# Patient Record
Sex: Male | Born: 1995 | Race: White | Hispanic: No | Marital: Single | Smoking: Current every day smoker
Health system: Southern US, Community
[De-identification: ages and names within clinical notes are randomized; demographics above are authoritative.]

## PROBLEM LIST (undated history)

## (undated) DIAGNOSIS — F988 Other specified behavioral and emotional disorders with onset usually occurring in childhood and adolescence: Secondary | ICD-10-CM

## (undated) HISTORY — PX: EYE SURGERY: SHX253

---
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
## 2015-10-25 ENCOUNTER — Ambulatory Visit (INDEPENDENT_AMBULATORY_CARE_PROVIDER_SITE_OTHER): Payer: Self-pay | Admitting: Physician Assistant

## 2015-10-25 VITALS — BP 140/86 | HR 97 | Temp 99.3°F | Resp 16

## 2015-10-25 DIAGNOSIS — S61201A Unspecified open wound of left index finger without damage to nail, initial encounter: Secondary | ICD-10-CM

## 2015-10-25 DIAGNOSIS — S61209A Unspecified open wound of unspecified finger without damage to nail, initial encounter: Secondary | ICD-10-CM

## 2015-10-25 MED ORDER — CEPHALEXIN 500 MG PO CAPS: 500.0000 mg | ORAL_CAPSULE | Freq: Three times a day (TID) | ORAL | Status: AC

## 2015-10-25 NOTE — Progress Notes (Signed)
   Patient ID: Zachary Bauer, male    DOB: Jul 29, 1995, 20 y.o.   MRN: [id number]  PCP: No PCP Per Patient  Chief Complaint  Patient presents with  . Laceration    left pointer finger while cutting    Subjective:  HPI Presents for evaluation of a wound of the LEFT index finger. He is accompanied by a friend.  RIGHT hand dominant. While chopping herbs, he accidentally cut the distal index finger, through the nail. Last tetanus was <3 years ago, by report. No previous injuries to this finger/hand.  Doesn't want stitches, afraid of the needle.   Review of Systems  Skin: Positive for wound.  Neurological: Negative for weakness and numbness.       There are no active problems to display for this patient.   No Known Allergies  Prior to Admission medications   Not on File     Past Medical, Surgical Family and Social History reviewed and updated.        Objective:  Physical Exam  Constitutional: He is oriented to person, place, and time. He appears well-developed and well-nourished. He is active and cooperative. No distress.  BP 140/86 mmHg  Pulse 97  Temp(Src) 99.3 F (37.4 C) (Oral)  Resp 16  SpO2 100%   Eyes: Conjunctivae are normal.  Pulmonary/Chest: Effort normal.  Musculoskeletal:       Left hand: He exhibits tenderness (wound), deformity (soft tissue wound) and laceration. He exhibits normal range of motion, no bony tenderness, normal capillary refill and no swelling. Normal sensation noted. Normal strength noted.       Hands: Neurological: He is alert and oriented to person, place, and time.  Skin: Skin is warm and dry. Laceration (distal index finger, radial aspect with near avulsion of a flap of skin that includes the nail. Bleeding.) noted. No rash noted. He is not diaphoretic. No pallor.  Psychiatric: His speech is normal and behavior is normal. Judgment and thought content normal. His mood appears anxious. His affect is not angry, not blunt, not  labile and not inappropriate. Cognition and memory are normal. He does not exhibit a depressed mood.    PROCEDURE: Verbal consent obtained from patient.  Anesthesia by metacarpal block with 4cc Lidocaine 2% without epinephrine.  Wound scrubbed with soap and water and rinsed.  Wound closed with #5 4-0 Ethilon simple interrupted sutures.  Wound cleansed and dressed.        Assessment & Plan:  1. Wound, open, finger, initial encounter Local wound care. Anticipatory guidance. RTC 3 days for wound check, 10 days for suture removal. - cephALEXin (KEFLEX) 500 MG capsule; Take 1 capsule (500 mg total) by mouth 3 (three) times daily.  Dispense: 30 capsule; Refill: 0   Fernande Brashelle S. Waverly Chavarria, PA-C Physician Assistant-Certified [facility]

## 2015-10-25 NOTE — Progress Notes (Signed)
   Subjective:    Patient ID: Zachary Bauer, male    DOB: 03-03-1996, 20 y.o.   MRN: [id number]  HPI Zachary Bauer presents today for a laceration to his left index finger over the nail. 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 Rinsed the wound with water but no soap. Bleeding has currently stopped. Very afraid of needles and not excited about the possibility of needing stitches. Last tetanus booster was within the last 3 years. He is right handed.   Review of Systems  Skin: Positive for wound. Negative for pallor.       LEFT index finger  Psychiatric/Behavioral: The patient is nervous/anxious.        Afraid of the possibility of stitches  All other systems reviewed and are negative.      Objective:   Physical Exam  Constitutional: He is oriented to person, place, and time. Vital signs are normal. He appears well-developed and well-nourished. He is cooperative. No distress.  Blood pressure 140/86, pulse 97, temperature 99.3 F (37.4 C), temperature source Oral, resp. rate 16, SpO2 100 %.   HENT:  Head: Normocephalic and atraumatic.  Neck: Normal range of motion. Neck supple.  Musculoskeletal: Normal range of motion.       Left hand: He exhibits tenderness, deformity and laceration. He exhibits normal range of motion.       Hands: Neurological: He is alert and oriented to person, place, and time.  Skin: Skin is warm. He is not diaphoretic. No cyanosis. No pallor.  Psychiatric: His speech is normal and behavior is normal. Judgment and thought content normal. His mood appears anxious. His affect is not inappropriate. Cognition and memory are normal.    Procedure Laceration repair with foil grafting Verbal consent given by patient. Laceration to distal part of left index finger over the fingernail Physician(s): Chelle Jeffery PA-C, Fortune Brannigan Rayburn PA-S Description: Wound was cleaned and prepped and draped. Digital nerve block  administered using 4 cc lidocaine 2% without epinephrine. Wound closed with a small piece of foil cut to cover tissue defect and 5 simple interrupted sutures with 4-0 ethilon. Complications: none EBL: minimal Dispo: Patient tolerated procedure well. Incision c/d/i and dry dressing placed over.     Assessment & Plan:  1. Laceration to LEFT index finger - repaired with foil grafting  - return on Monday 7/24 for wound check - f/u in 10 days for suture removal - Keflex 500 mg PO TID x 10 days  Zachary Blank Rayburn PA-S 10/25/2015

## 2015-10-25 NOTE — Patient Instructions (Signed)
WOUND CARE Please return in 3 days for a wound check and in 10 days to have your stitches/staples removed or sooner if you have concerns. Marland Kitchen. Keep area clean and dry for 24 hours. Do not remove bandage, if applied. . After 24 hours, remove bandage and wash wound gently with mild soap and warm water. Reapply a new bandage after cleaning wound, if directed. . Continue daily cleansing with soap and water until stitches/staples are removed. . Do not apply any ointments or creams to the wound while stitches/staples are in place, as this may cause delayed healing. . Notify the office if you experience any of the following signs of infection: Swelling, redness, pus drainage, streaking, fever >101.0 F . Notify the office if you experience excessive bleeding that does not stop after 15-20 minutes of constant, firm pressure.      IF you received an x-ray today, you will receive an invoice from [facility]. Please contact [facility] at [phone] with questions or concerns regarding your invoice.   IF you received labwork today, you will receive an invoice from [facility]. Please contact [facility] at [phone] with questions or concerns regarding your invoice.   Our billing staff will not be able to assist you with questions regarding bills from these companies.  You will be contacted with the lab results as soon as they are available. The fastest way to get your results is to activate your My Chart account. Instructions are located on the last page of this paperwork. If you have not heard from us regarding the results in 2 weeks, please contact this office.

## 2015-11-04 ENCOUNTER — Encounter (HOSPITAL_COMMUNITY): Payer: Self-pay | Admitting: Emergency Medicine

## 2015-11-04 ENCOUNTER — Ambulatory Visit (HOSPITAL_COMMUNITY)
Admission: EM | Admit: 2015-11-04 | Discharge: 2015-11-04 | Disposition: A | Discharge status: Home / Self Care | Payer: Medicaid Other | Attending: Family Medicine | Admitting: Family Medicine

## 2015-11-04 DIAGNOSIS — Z4802 Encounter for removal of sutures: Secondary | ICD-10-CM

## 2015-11-04 NOTE — ED Provider Notes (Signed)
CSN: [id number]     Arrival date & time 11/04/15  1711 History   None    No chief complaint on file.  (Consider location/radiation/quality/duration/timing/severity/associated sxs/prior Treatment) Patient had his right index fingernail avulsed and he is having his sutures removed today.    Hand Pain  This is a new problem. The current episode started more than 1 week ago. The problem occurs constantly. The problem has not changed since onset.Nothing aggravates the symptoms. Nothing relieves the symptoms. He has tried nothing for the symptoms.    Past Medical History:  Diagnosis Date  . ADD (attention deficit disorder)    Past Surgical History:  Procedure Laterality Date  . EYE SURGERY     Family History  Problem Relation Age of Onset  . Cancer Other    Social History  Substance Use Topics  . Smoking status: Current Every Day Smoker    Packs/day: 0.50    Types: Cigarettes    Last attempt to quit: 08/29/2013  . Smokeless tobacco: Not on file  . Alcohol use No    Review of Systems  Constitutional: Negative.   HENT: Negative.   Eyes: Negative.   Respiratory: Negative.   Cardiovascular: Negative.   Gastrointestinal: Negative.   Endocrine: Negative.   Genitourinary: Negative.   Musculoskeletal: Negative.   Skin: Positive for wound.  Allergic/Immunologic: Negative.   Neurological: Negative.   Hematological: Negative.   Psychiatric/Behavioral: Negative.     Allergies  Review of patient's allergies indicates no known allergies.  Home Medications   Prior to Admission medications   Medication Sig Start Date End Date Taking? Authorizing Provider  cephALEXin (KEFLEX) 500 MG capsule Take 1 capsule (500 mg total) by mouth 3 (three) times daily. 10/25/15 11/04/15  Porfirio Oar, PA-C   Meds Ordered and Administered this Visit  Medications - No data to display  BP 116/83 (BP Location: Right Arm)   Pulse 70   Temp 98.5 F (36.9 C) (Oral)   SpO2 100%  No data  found.   Physical Exam  Constitutional: He appears well-developed and well-nourished.  Eyes: Conjunctivae and EOM are normal. Pupils are equal, round, and reactive to light.  Cardiovascular: Normal rate, regular rhythm, normal heart sounds and intact distal pulses.   Pulmonary/Chest: Effort normal and breath sounds normal.  Musculoskeletal:  Right index finger with half the finger nail avulsed off and tin foil is sutured over the  Nail bed.  Nursing note and vitals reviewed.   Urgent Care Course   Clinical Course    .Suture Removal Date/Time: 11/04/2015 6:35 PM Performed by: Deatra Canter Authorized by: Bradd Canary D   Consent:    Consent obtained:  Verbal   Consent given by:  Patient   Risks discussed:  Pain   Alternatives discussed:  No treatment Location:    Location:  Upper extremity   Upper extremity location:  Hand   Hand location:  L index finger   (including critical care time)  Labs Review Labs Reviewed - No data to display  Imaging Review No results found.   Visual Acuity Review  Right Eye Distance:   Left Eye Distance:   Bilateral Distance:    Right Eye Near:   Left Eye Near:    Bilateral Near:         MDM   1. Visit for suture removal    5 sutures left index finger removed  Continue with neosporin ointment and dressing on finger tip.   Deatra Canter, FNP  11/04/15 1838  

## 2015-11-04 NOTE — ED Triage Notes (Signed)
Patient seen at another location 7/21 for injury to left index finger.  Patient here today for suture removal

## 2016-12-07 ENCOUNTER — Emergency Department (HOSPITAL_COMMUNITY): Payer: Self-pay

## 2016-12-07 ENCOUNTER — Emergency Department (HOSPITAL_COMMUNITY)
Admission: EM | Admit: 2016-12-07 | Discharge: 2016-12-07 | Disposition: A | Discharge status: Home / Self Care | Payer: Self-pay | Attending: Emergency Medicine | Admitting: Emergency Medicine

## 2016-12-07 DIAGNOSIS — R062 Wheezing: Secondary | ICD-10-CM | POA: Insufficient documentation

## 2016-12-07 DIAGNOSIS — F17210 Nicotine dependence, cigarettes, uncomplicated: Secondary | ICD-10-CM | POA: Insufficient documentation

## 2016-12-07 DIAGNOSIS — B349 Viral infection, unspecified: Secondary | ICD-10-CM | POA: Insufficient documentation

## 2016-12-07 DIAGNOSIS — J069 Acute upper respiratory infection, unspecified: Secondary | ICD-10-CM | POA: Insufficient documentation

## 2016-12-07 LAB — RAPID STREP SCREEN (MED CTR MEBANE ONLY): Streptococcus, Group A Screen (Direct): NEGATIVE

## 2016-12-07 MED ORDER — IPRATROPIUM-ALBUTEROL 0.5-2.5 (3) MG/3ML IN SOLN
3.0000 mL | Freq: Once | RESPIRATORY_TRACT | Status: AC
  Administered 2016-12-07: 3 mL via RESPIRATORY_TRACT
  Filled 2016-12-07: qty 3

## 2016-12-07 MED ORDER — ALBUTEROL SULFATE HFA 108 (90 BASE) MCG/ACT IN AERS
2.0000 | INHALATION_SPRAY | Freq: Once | RESPIRATORY_TRACT | Status: AC
  Administered 2016-12-07: 2 via RESPIRATORY_TRACT
  Filled 2016-12-07: qty 6.7

## 2016-12-07 MED ORDER — ALBUTEROL SULFATE HFA 108 (90 BASE) MCG/ACT IN AERS: 1.0000 | INHALATION_SPRAY | Freq: Four times a day (QID) | RESPIRATORY_TRACT | 0 refills | Status: AC | PRN

## 2016-12-07 MED ORDER — AEROCHAMBER PLUS FLO-VU MEDIUM MISC
1.0000 | Freq: Once | Status: AC
  Administered 2016-12-07: 1

## 2016-12-07 NOTE — ED Triage Notes (Signed)
Pt states that he has had generalized body aches, chest pain when he coughs and a sore throat x 3 days. Alert and oriented.

## 2016-12-07 NOTE — ED Provider Notes (Signed)
[facility]-EMERGENCY DEPT Provider Note   CSN: [id number] Arrival date & time: 12/07/16  1141     History   Chief Complaint Chief Complaint  Patient presents with  . Generalized Body Aches  . Sore Throat    HPI Zachary Bauer is a 21 y.o. male presents to ED for evaluation of chest tightness, dry cough, chills and sore throat x 4 days. Has not tried any OTC for symptoms. No fevers, shortness of breath, chest pain. States a few people from work have been sick last week. He works around food. Smokes 1 pack of cigarettes every 3 days. No h/o asthma.   HPI  Past Medical History:  Diagnosis Date  . ADD (attention deficit disorder)     There are no active problems to display for this patient.   Past Surgical History:  Procedure Laterality Date  . EYE SURGERY         Home Medications    Prior to Admission medications   Medication Sig Start Date End Date Taking? Authorizing Provider  albuterol (PROVENTIL HFA;VENTOLIN HFA) 108 (90 Base) MCG/ACT inhaler Inhale 1-2 puffs into the lungs every 6 (six) hours as needed for wheezing or shortness of breath. 12/07/16   Liberty HandyGibbons, Bryann Gentz J, PA-C    Family History Family History  Problem Relation Age of Onset  . Cancer Other     Social History Social History  Substance Use Topics  . Smoking status: Current Every Day Smoker    Packs/day: 0.50    Types: Cigarettes    Last attempt to quit: 08/29/2013  . Smokeless tobacco: Not on file  . Alcohol use No     Allergies   Patient has no known allergies.   Review of Systems Review of Systems  Constitutional: Positive for chills. Negative for diaphoresis and fever.  HENT: Negative for congestion and sore throat.   Respiratory: Positive for cough and chest tightness. Negative for shortness of breath.   Cardiovascular: Negative for chest pain.  Musculoskeletal: Negative for myalgias.     Physical Exam Updated Vital Signs BP 123/85 (BP Location: Right Arm)   Pulse (!) 104    Temp 98.1 F (36.7 C) (Oral)   Resp 18   SpO2 100%   Physical Exam  Constitutional: He is oriented to person, place, and time. He appears well-developed and well-nourished. No distress.  NAD.  HENT:  Head: Normocephalic and atraumatic.  Right Ear: Tympanic membrane and external ear normal.  Left Ear: Tympanic membrane and external ear normal.  Nose: Nose normal. No mucosal edema.  Mouth/Throat: Mucous membranes are normal. Posterior oropharyngeal erythema present. Tonsils are 1+ on the right. Tonsils are 1+ on the left.  Eyes: Conjunctivae and EOM are normal. No scleral icterus.  Neck: Normal range of motion. Neck supple.  Cardiovascular: Normal rate, regular rhythm, normal heart sounds and intact distal pulses.   No murmur heard. Tachycardic   Pulmonary/Chest: Effort normal. He has wheezes in the right upper field, the right middle field, the left upper field and the left middle field.  Diffuse wheezing in upper and middle lobes bilaterally, louder on left>right  Musculoskeletal: Normal range of motion. He exhibits no deformity.  Neurological: He is alert and oriented to person, place, and time.  Skin: Skin is warm and dry. Capillary refill takes less than 2 seconds.  Psychiatric: He has a normal mood and affect. His behavior is normal. Judgment and thought content normal.  Nursing note and vitals reviewed.    ED Treatments /  Results  Labs (all labs ordered are listed, but only abnormal results are displayed) Labs Reviewed  RAPID STREP SCREEN (NOT AT [facility])  CULTURE, GROUP A STREP [facility])    EKG  EKG Interpretation None       Radiology Dg Chest 2 View  Result Date: 12/07/2016 CLINICAL DATA:  Pt states that he has had generalized body aches, chest pain when he coughs and a sore throat x 3 days. Current every day smoker. EXAM: CHEST - 2 VIEW COMPARISON:  none FINDINGS: Lungs are clear. Heart size and mediastinal contours are within normal limits. No effusion. Visualized  bones unremarkable. IMPRESSION: No acute cardiopulmonary disease. Electronically Signed   By: Corlis Leak M.D.   On: 12/07/2016 13:54    Procedures Procedures (including critical care time)  Medications Ordered in ED Medications  ipratropium-albuterol (DUONEB) 0.5-2.5 (3) MG/3ML nebulizer solution 3 mL (3 mLs Nebulization Given 12/07/16 1355)     Initial Impression / Assessment and Plan / ED Course  I have reviewed the triage vital signs and the nursing notes.  Pertinent labs & imaging results that were available during my care of the patient were reviewed by me and considered in my medical decision making (see chart for details).     21 y.o. -year-old male with pertinent pmh of tobacco abuse presents to the ED with URI symptoms  4 days. On my exam patient is nontoxic appearing, speaking in full sentences. No fever, no tachypnea, no tachycardia, normal oxygen saturations. He has diffuse wheezing L>R on exam. CXR negative. I think that symptoms can be treated conservatively at this point. Given reassuring physical exam and vital signs within normal limits patient will be discharged with symptomatic treatment and albuterol inhaler. Strict ED return precautions given. Patient is aware that a viral URI infection may precede the onset of pneumonia. Patient is aware of red flag symptoms to monitor for that would warrant return to the ED for further reevaluation.  Final Clinical Impressions(s) / ED Diagnoses   Final diagnoses:  Viral upper respiratory illness    New Prescriptions New Prescriptions   ALBUTEROL (PROVENTIL HFA;VENTOLIN HFA) 108 (90 BASE) MCG/ACT INHALER    Inhale 1-2 puffs into the lungs every 6 (six) hours as needed for wheezing or shortness of breath.     Liberty Handy, PA-C 12/07/16 1418    Lorre Nick, MD 12/10/16 1409

## 2016-12-07 NOTE — Discharge Instructions (Signed)
You were evaluated in the emergency department for cough, chest tightness and sore throat. You have wheezing in your lungs. Your x-ray was normal without pneumonia. I suspect you have a viral upper respiratory infection. This is usually self limiting and will resolve within 7-10 days.  Use albuterol inhaler for chest tightness and wheezing. Tylenol and ibuprofen for sore throat. Drink plenty of fluids. Stop smoking.   Return to ED if your symptoms worsen in 7-10 days total, if you develop fevers, shortness of breath, chest pain.

## 2016-12-09 LAB — CULTURE, GROUP A STREP (THRC)

## 2017-10-03 ENCOUNTER — Emergency Department (HOSPITAL_COMMUNITY)
Admission: EM | Admit: 2017-10-03 | Discharge: 2017-10-03 | Disposition: A | Discharge status: Home / Self Care | Payer: Self-pay | Attending: Emergency Medicine | Admitting: Emergency Medicine

## 2017-10-03 ENCOUNTER — Emergency Department (HOSPITAL_COMMUNITY): Payer: Self-pay

## 2017-10-03 DIAGNOSIS — F1910 Other psychoactive substance abuse, uncomplicated: Secondary | ICD-10-CM | POA: Insufficient documentation

## 2017-10-03 DIAGNOSIS — F17210 Nicotine dependence, cigarettes, uncomplicated: Secondary | ICD-10-CM | POA: Insufficient documentation

## 2017-10-03 DIAGNOSIS — R4701 Aphasia: Secondary | ICD-10-CM | POA: Insufficient documentation

## 2017-10-03 LAB — COMPREHENSIVE METABOLIC PANEL
ALBUMIN: 4.3 g/dL (ref 3.5–5.0)
ALT: 45 U/L — ABNORMAL HIGH (ref 0–44)
ANION GAP: 11 (ref 5–15)
AST: 28 U/L (ref 15–41)
Alkaline Phosphatase: 59 U/L (ref 38–126)
BILIRUBIN TOTAL: 0.4 mg/dL (ref 0.3–1.2)
BUN: 11 mg/dL (ref 6–20)
CO2: 21 mmol/L — AB (ref 22–32)
Calcium: 8.4 mg/dL — ABNORMAL LOW (ref 8.9–10.3)
Chloride: 111 mmol/L (ref 98–111)
Creatinine, Ser: 0.92 mg/dL (ref 0.61–1.24)
GFR calc Af Amer: >60 mL/min (ref >60)
GFR calc non Af Amer: >60 mL/min (ref >60)
Glucose, Bld: 107 mg/dL — ABNORMAL HIGH (ref 70–99)
POTASSIUM: 3.8 mmol/L (ref 3.5–5.1)
SODIUM: 143 mmol/L (ref 135–145)
Total Protein: 7.7 g/dL (ref 6.5–8.1)

## 2017-10-03 LAB — RAPID URINE DRUG SCREEN, HOSP PERFORMED
Amphetamines: NOT DETECTED
Benzodiazepines: NOT DETECTED
Cocaine: POSITIVE — AB
Opiates: NOT DETECTED
Tetrahydrocannabinol: POSITIVE — AB

## 2017-10-03 LAB — CBC
HEMATOCRIT: 47.9 % (ref 39.0–52.0)
HEMOGLOBIN: 17.3 g/dL — AB (ref 13.0–17.0)
MCH: 32.8 pg (ref 26.0–34.0)
MCHC: 36.1 g/dL — AB (ref 30.0–36.0)
MCV: 90.7 fL (ref 78.0–100.0)
Platelets: 285 10*3/uL (ref 150–400)
RBC: 5.28 MIL/uL (ref 4.22–5.81)
RDW: 12.6 % (ref 11.5–15.5)
WBC: 9.1 10*3/uL (ref 4.0–10.5)

## 2017-10-03 MED ORDER — LORAZEPAM 2 MG/ML IJ SOLN
0.5000 mg | Freq: Once | INTRAMUSCULAR | Status: AC | PRN
  Administered 2017-10-03: 1 mg via INTRAVENOUS
  Filled 2017-10-03: qty 1

## 2017-10-03 MED ORDER — GADOBENATE DIMEGLUMINE 529 MG/ML IV SOLN
20.0000 mL | Freq: Once | INTRAVENOUS | Status: AC | PRN
  Administered 2017-10-03: 20 mL via INTRAVENOUS

## 2017-10-03 MED ORDER — SODIUM CHLORIDE 0.9 % IV BOLUS
1000.0000 mL | Freq: Once | INTRAVENOUS | Status: AC
  Administered 2017-10-03: 1000 mL via INTRAVENOUS

## 2017-10-03 MED ORDER — VALPROATE SODIUM 500 MG/5ML IV SOLN
1000.0000 mg | Freq: Once | INTRAVENOUS | Status: AC
  Administered 2017-10-03: 1000 mg via INTRAVENOUS
  Filled 2017-10-03: qty 10

## 2017-10-03 MED ORDER — VALPROATE SODIUM 100 MG/ML IV SOLN: 1000.0000 mg | Freq: Once | INTRAVENOUS | Status: DC

## 2017-10-03 MED ORDER — PROCHLORPERAZINE EDISYLATE 10 MG/2ML IJ SOLN
10.0000 mg | Freq: Once | INTRAMUSCULAR | Status: AC
  Administered 2017-10-03: 10 mg via INTRAVENOUS
  Filled 2017-10-03: qty 2

## 2017-10-03 NOTE — ED Notes (Signed)
Patient states his friend noted 2 days ago he was having "difficulty getting his words out" after he did mushrooms.

## 2017-10-03 NOTE — Discharge Instructions (Signed)
Call [facility] to schedule a follow up appointment.  Neurology recommends avoiding all alcohol and drugs.  You can take folate or vitamin B12, both of which are available over-the-counter as directed on the label.  Continue to drink plenty of fluids and stay hydrated.  Return to the emergency department if you develop a high fever, have loss of vision in one or both eyes, develop persistent vomiting, or have other new, concerning symptoms.

## 2017-10-03 NOTE — ED Notes (Signed)
Pt to MRI

## 2017-10-03 NOTE — ED Triage Notes (Addendum)
Patient was acting dysphasic with EMS. Patient stated he drunk 3 beers. Patient couldn't form his sentences. Patient or friends could not tell when the symptoms started. Patient did not show any neurologic problems per EMS. Patient also states he ate mushrooms.

## 2017-10-03 NOTE — ED Provider Notes (Signed)
22 year old male [facility] MRI and labs. Per her HPI:   "22 year old male presents to the emergency department by EMS.  EMS was called by friend as patient did not appear to be acting himself.  He reports use of 3 mushrooms 2 days ago.  He has had some trouble with speech and communication since waking up yesterday.  He states that he feels as though he cannot get his thoughts together.  He did drink 3 beers after work today.  This was when a friend noticed that he had difficulty forming sentences.  He has no complaints of any pain, nausea.  No vomiting.  Denies use of any other illicit substances.  He does report prior use of mushrooms "when I was younger", but states that it has never had this effect."  Physical Exam  BP 117/72 (BP Location: Right Arm)   Pulse 83   Temp 98.2 F (36.8 C) (Oral)   Resp 14   Ht 5\' 6"  (1.676 m)   Wt 102.1 kg (225 lb)   SpO2 98%   BMI 36.32 kg/m   Physical Exam  Cranial nerves II through XII are grossly intact.  Patient is following simple commands.  No dysmetria with finger-to-nose bilaterally.  Patient appears to have blurred vision in the temporal peripheries of the bilateral eyes.  Nasal visual fields are intact bilaterally.  No loss of visual fields.  ED Course/Procedures     Procedures  MDM   22 year old male with no pertinent past medical history [place] [facility] [place] brain.  He has been having difficulty with speech and communication since yesterday morning.  He endorses having 3 alcoholic beverages last night, mushroom used 2 nights ago, and cocaine use 1 week ago.  Labs are unremarkable.  MRI is negative.  12:10 PM-MRI is negative.  On examination, the patient continues to have difficulty with word searching and questionable changes to the bilateral temporal visual fields.  Spoke with Dr. Amada JupiterKirkpatrick, neurology, who recommended Depakote and Compazine for possible complicated migraine versus  permanent damage secondary to psychoactive drug use.  Will also give the patient another liter of fluids as his urine looks very concentrated.   Patient rechecked following IV fluid, Depakote, and Compazine administration.  Family reports that now he is able to form fragments and sentences that are a couple of words long.  Previously, he was able to form 1 word answers.  The patient will be staying with his grandmother for the next few days.  He has been given a referral to outpatient neurology for follow-up.  He is also been advised to avoid all recreational and illicit drugs.  Strict return precautions given.  The patient is hemodynamically stable in no acute distress.  He is safe for discharge home at this time.      Barkley BoardsMcDonald, Batya Citron A, PA-C 10/03/17 1805    Terrilee FilesButler, Michael C, MD 10/05/17 1050

## 2017-10-03 NOTE — ED Provider Notes (Signed)
[facility]-EMERGENCY DEPT Provider Note   CSN: [id number] Arrival date & time: 10/03/17  0417    History   Chief Complaint Chief Complaint  Patient presents with  . Altered Mental Status  . Alcohol Intoxication    HPI Zachary Bauer is a 22 y.o. male.   22 year old male presents to the emergency department by EMS.  EMS was called by friend as patient did not appear to be acting himself.  He reports use of 3 mushrooms 2 days ago.  He has had some trouble with speech and communication since waking up yesterday.  He states that he feels as though he cannot get his thoughts together.  He did drink 3 beers after work today.  This was when a friend noticed that he had difficulty forming sentences.  He has no complaints of any pain, nausea.  No vomiting.  Denies use of any other illicit substances.  He does report prior use of mushrooms "when I was younger", but states that it has never had this effect.  The history is provided by the patient. No language interpreter was used.  Altered Mental Status    Alcohol Intoxication     Past Medical History:  Diagnosis Date  . ADD (attention deficit disorder)     There are no active problems to display for this patient.   Past Surgical History:  Procedure Laterality Date  . EYE SURGERY          Home Medications    Prior to Admission medications   Medication Sig Start Date End Date Taking? Authorizing Provider  albuterol (PROVENTIL HFA;VENTOLIN HFA) 108 (90 Base) MCG/ACT inhaler Inhale 1-2 puffs into the lungs every 6 (six) hours as needed for wheezing or shortness of breath. 12/07/16   Liberty Handy, PA-C    Family History Family History  Problem Relation Age of Onset  . Cancer Other     Social History Social History   Tobacco Use  . Smoking status: Current Every Day Smoker    Packs/day: 0.50    Types: Cigarettes    Last attempt to quit: 08/29/2013    Years since quitting: 4.0  Substance  Use Topics  . Alcohol use: No  . Drug use: No     Allergies   Patient has no known allergies.   Review of Systems Review of Systems Ten systems reviewed and are negative for acute change, except as noted in the HPI.    Physical Exam Updated Vital Signs BP 112/61   Pulse 100   Temp 98.2 F (36.8 C) (Oral)   Resp (!) 25   Ht 5\' 6"  (1.676 m)   Wt 102.1 kg (225 lb)   SpO2 94%   BMI 36.32 kg/m   Physical Exam  Constitutional: He is oriented to person, place, and time. He appears well-developed and well-nourished. No distress.  Nontoxic appearing; anxious  HENT:  Head: Normocephalic and atraumatic.  Eyes: No scleral icterus. Right eye exhibits nystagmus. Left eye exhibits nystagmus.  Bilateral injected conjunctiva  Neck: Normal range of motion.  Cardiovascular: Regular rhythm and intact distal pulses.  Borderline tachycardia  Pulmonary/Chest: Effort normal. No stridor. No respiratory distress. He has no wheezes. He has no rales.  Lungs CTAB  Abdominal: Soft. He exhibits no distension. There is no tenderness. There is no guarding.  Musculoskeletal: Normal range of motion.  Neurological: He is alert and oriented to person, place, and time. He exhibits normal muscle tone. Coordination normal.  Speech is  hesitant, but goal oriented. Seeming to have trouble with word finding. Answers questions appropriately when able to gather thoughts. No facial asymmetry. 5/5 strength against resistance in all major muscle groups bilaterally. Moving all extremities and ambulatory with steady gait.   Skin: Skin is warm and dry. No rash noted. He is not diaphoretic. No erythema. No pallor.  Psychiatric: He has a normal mood and affect. His behavior is normal.  Nursing note and vitals reviewed.    ED Treatments / Results  Labs (all labs ordered are listed, but only abnormal results are displayed) Labs Reviewed  RAPID URINE DRUG SCREEN, HOSP PERFORMED - Abnormal; Notable for the following  components:      Result Value   Cocaine POSITIVE (*)    Tetrahydrocannabinol POSITIVE (*)    Barbiturates   (*)    Value: Result not available. Reagent lot number recalled by manufacturer.   All other components within normal limits  CBC - Abnormal; Notable for the following components:   Hemoglobin 17.3 (*)    MCHC 36.1 (*)    All other components within normal limits  COMPREHENSIVE METABOLIC PANEL    EKG None  Radiology No results found.  Procedures Procedures (including critical care time)  Medications Ordered in ED Medications  LORazepam (ATIVAN) injection 0.5-1 mg (has no administration in time range)  sodium chloride 0.9 % bolus 1,000 mL (0 mLs Intravenous Stopped 10/03/17 0555)     Initial Impression / Assessment and Plan / ED Course  I have reviewed the triage vital signs and the nursing notes.  Pertinent labs & imaging results that were available during my care of the patient were reviewed by me and considered in my medical decision making (see chart for details).     4:35 AM 22 y/o male presents to the ED for speech difficulty. Symptoms noticed yesterday morning, persisted through work. Hx of mushroom ingestion on Thursday night. Denies use of other illicit substances. Did drink 3 beers tonight. Coworker called EMS over concern for continued speech changes. Patient denies pain, including headache, abdominal pain. No N/V. No fevers. Appears anxious and frustrated. Bilateral injected conjunctiva.  7:28 AM Patient reassessed. He remains aphasic, most c/w Broca's aphasia. Roommate at bedside, stating that patient ingested golden cap mushrooms. UDS also positive for cocaine and marijuana, but patient reports this use to be 1 week ago. He is noted to have bilateral nystagmus at EOMs on repeat exam by MD. 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  In light of recent polysubstance use, continued symptoms, MRI ordered for further  evaluation. Will check lytes, kidney, and liver function tests. If imaging negative, unable to completely exclude side effects of polysubstance use. Patient signed out to Frederik PearMia McDonald, PA-C at shift change who will follow up on imaging and disposition appropriately.   Final Clinical Impressions(s) / ED Diagnoses   Final diagnoses:  Aphasia  Polysubstance abuse [facility](HCC)    ED Discharge Orders    None       Antony MaduraHumes, Lavert Matousek, PA-C 10/03/17 [id number]    Nira Connardama, Pedro Eduardo, MD 10/03/17 2001

## 2017-10-03 NOTE — ED Notes (Signed)
Bed: WA17 Expected date:  Expected time:  Means of arrival:  Comments: EMS 22 yo male intoxicated and altered

## 2018-04-19 ENCOUNTER — Emergency Department (HOSPITAL_COMMUNITY): Payer: BLUE CROSS/BLUE SHIELD

## 2018-04-19 ENCOUNTER — Encounter (HOSPITAL_COMMUNITY): Payer: Self-pay | Admitting: Emergency Medicine

## 2018-04-19 ENCOUNTER — Emergency Department (HOSPITAL_COMMUNITY)
Admission: EM | Admit: 2018-04-19 | Discharge: 2018-04-19 | Disposition: A | Discharge status: Home / Self Care | Payer: BLUE CROSS/BLUE SHIELD | Attending: Emergency Medicine | Admitting: Emergency Medicine

## 2018-04-19 DIAGNOSIS — J101 Influenza due to other identified influenza virus with other respiratory manifestations: Secondary | ICD-10-CM | POA: Insufficient documentation

## 2018-04-19 DIAGNOSIS — F17210 Nicotine dependence, cigarettes, uncomplicated: Secondary | ICD-10-CM | POA: Insufficient documentation

## 2018-04-19 DIAGNOSIS — J029 Acute pharyngitis, unspecified: Secondary | ICD-10-CM | POA: Diagnosis present

## 2018-04-19 LAB — GROUP A STREP BY PCR: Group A Strep by PCR: NOT DETECTED

## 2018-04-19 LAB — INFLUENZA PANEL BY PCR (TYPE A & B)
Influenza A By PCR: NEGATIVE
Influenza B By PCR: POSITIVE — AB

## 2018-04-19 MED ORDER — ACETAMINOPHEN 325 MG PO TABS
650.0000 mg | ORAL_TABLET | Freq: Once | ORAL | Status: AC | PRN
  Administered 2018-04-19: 650 mg via ORAL
  Filled 2018-04-19: qty 2

## 2018-04-19 NOTE — ED Provider Notes (Signed)
[facility]-EMERGENCY DEPT Provider Note   CSN: [id number] Arrival date & time: 04/19/18  1433     History   Chief Complaint Chief Complaint  Patient presents with  . Cough  . Sore Throat    HPI Zachary Bauer is a 23 y.o. male.  23 y/o male smoker with a PMH of ADD, Asthma presents to the ED with a chief complaint of generalized body aches, sore throat and dry cough x few days. Patient reports his symptoms and with a sore throat and a dry cough which has not improved.  Patient now reports generalized body aches along with a fever.  Patient does have an inhaler at home but has not tried any therapy for relieving symptoms.  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  He reports all generalized body aches and fevers.  He denies any shortness of breath, chest pain, abdominal pain or other complaints.     Past Medical History:  Diagnosis Date  . ADD (attention deficit disorder)     There are no active problems to display for this patient.   Past Surgical History:  Procedure Laterality Date  . EYE SURGERY          Home Medications    Prior to Admission medications   Medication Sig Start Date End Date Taking? Authorizing Provider  albuterol (PROVENTIL HFA;VENTOLIN HFA) 108 (90 Base) MCG/ACT inhaler Inhale 1-2 puffs into the lungs every 6 (six) hours as needed for wheezing or shortness of breath. 12/07/16   Liberty Handy, PA-C    Family History Family History  Problem Relation Age of Onset  . Cancer Other     Social History Social History   Tobacco Use  . Smoking status: Current Every Day Smoker    Packs/day: 0.50    Types: Cigarettes    Last attempt to quit: 08/29/2013    Years since quitting: 4.6  . Smokeless tobacco: Never Used  Substance Use Topics  . Alcohol use: No  . Drug use: No     Allergies   Patient has no known allergies.   Review of Systems Review of Systems    Constitutional: Positive for fever.  Respiratory: Positive for cough.   Musculoskeletal: Positive for myalgias.     Physical Exam Updated Vital Signs BP (!) 153/89   Pulse 96   Temp (!) 100.5 F (38.1 C) (Oral)   Resp 18   Ht 5\' 6"  (1.676 m)   Wt 108.9 kg   SpO2 97%   BMI 38.74 kg/m   Physical Exam Vitals signs and nursing note reviewed.  Constitutional:      Appearance: He is well-developed. He is ill-appearing.  HENT:     Head: Normocephalic and atraumatic.  Eyes:     General: No scleral icterus.    Pupils: Pupils are equal, round, and reactive to light.  Neck:     Musculoskeletal: Normal range of motion.  Cardiovascular:     Heart sounds: Normal heart sounds.  Pulmonary:     Effort: Pulmonary effort is normal.     Breath sounds: Examination of the right-middle field reveals decreased breath sounds. Examination of the left-middle field reveals decreased breath sounds. Examination of the right-lower field reveals decreased breath sounds. Examination of the left-lower field reveals decreased breath sounds. Decreased breath sounds present. No wheezing.  Chest:     Chest wall: No tenderness.  Abdominal:     General: Bowel sounds are  normal. There is no distension.     Palpations: Abdomen is soft.     Tenderness: There is no abdominal tenderness.  Musculoskeletal:        General: No tenderness or deformity.  Skin:    General: Skin is warm.  Neurological:     Mental Status: He is alert and oriented to person, place, and time.      ED Treatments / Results  Labs (all labs ordered are listed, but only abnormal results are displayed) Labs Reviewed  INFLUENZA PANEL BY PCR (TYPE A & B) - Abnormal; Notable for the following components:      Result Value   Influenza B By PCR POSITIVE (*)    All other components within normal limits  GROUP A STREP BY PCR    EKG None  Radiology Dg Chest 2 View  Result Date: 04/19/2018 CLINICAL DATA:  23 year old male with cough  and body aches. EXAM: CHEST - 2 VIEW COMPARISON:  12/07/2016. FINDINGS: The heart size and mediastinal contours are within normal limits. Both lungs are clear. No pneumothorax or pleural effusion. Negative visible bowel gas pattern. The visualized skeletal structures are unremarkable. IMPRESSION: Negative.  No cardiopulmonary abnormality. Electronically Signed   By: Odessa Fleming M.D.   On: 04/19/2018 19:49    Procedures Procedures (including critical care time)  Medications Ordered in ED Medications  acetaminophen (TYLENOL) tablet 650 mg (650 mg Oral Given 04/19/18 1724)     Initial Impression / Assessment and Plan / ED Course  I have reviewed the triage vital signs and the nursing notes.  Pertinent labs & imaging results that were available during my care of the patient were reviewed by me and considered in my medical decision making (see chart for details).    Presents with URI symptoms, x3 days.  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  PCR influenza was positive for influenza B.  PCR strep pharyngitis was negative.  Chest x-ray obtained to rule out any pneumonia as patient has a previous history of asthma and is a current smoker.  Chest x-ray was negative for any consolidation.  At this time patient has remained febrile while ED visit, slight tachycardia but no hypoxia satting in 99%.  Patient is instructed to hydrate with plenty of fluids and treat his fever.  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  Return precautions have been discussed at length with patient.  Final Clinical Impressions(s) / ED Diagnoses   Final diagnoses:  Influenza B    ED Discharge Orders    None       Freddy Jaksch 04/19/18 2047    Benjiman Core, MD 04/19/18 [phone]

## 2018-04-19 NOTE — Discharge Instructions (Signed)
Please take tylenol or Profen to help with your fever.  Please remain hydrated with plenty of fluids.  I have provided a note for work. If you experience any chest pain, shortness of breath or worsening symptoms please return to the ED.

## 2018-04-19 NOTE — ED Triage Notes (Signed)
Pt c/o cough, sore throat, body aches for several days.

## 2019-03-24 ENCOUNTER — Other Ambulatory Visit: Payer: Self-pay | Admitting: Cardiology

## 2019-03-24 DIAGNOSIS — Z20822 Contact with and (suspected) exposure to covid-19: Secondary | ICD-10-CM

## 2019-03-26 LAB — NOVEL CORONAVIRUS, NAA: SARS-CoV-2, NAA: NOT DETECTED

## 2019-05-13 IMAGING — MR MR HEAD WO/W CM
12 of 13 series · 41 of 48 positions shown · IV contrast (Yes)
Comparison: 02/15/2007 CT

CLINICAL DATA: Unable to speak after drinking 3 beers and eating
mushrooms

EXAM:
MRI HEAD WITHOUT AND WITH CONTRAST
TECHNIQUE: Multiplanar, multiecho pulse sequences of the brain and surrounding
structures were obtained without and with intravenous contrast.
CONTRAST:  20mL MULTIHANCE GADOBENATE DIMEGLUMINE 529 MG/ML IV SOLN

[Series 3: T1 · sagittal · 5.0mm · 0.47mm/px · 3 of 24 slices shown]
[im 1/24]
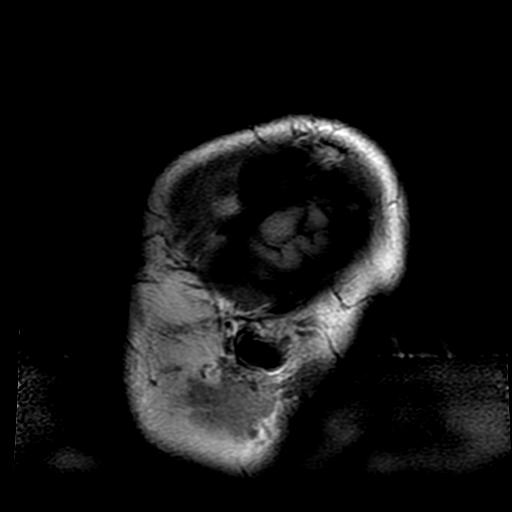
[im 12/24]
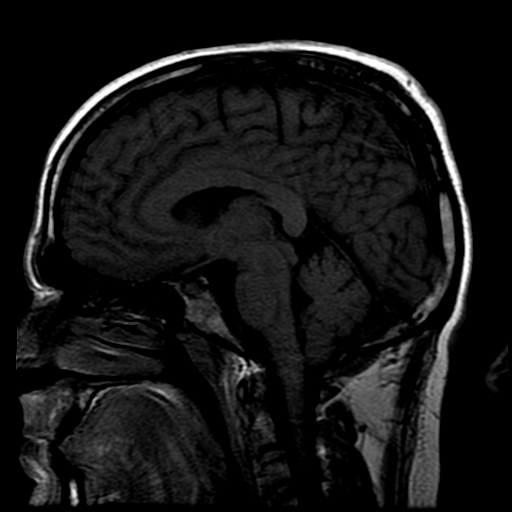
[im 24/24]
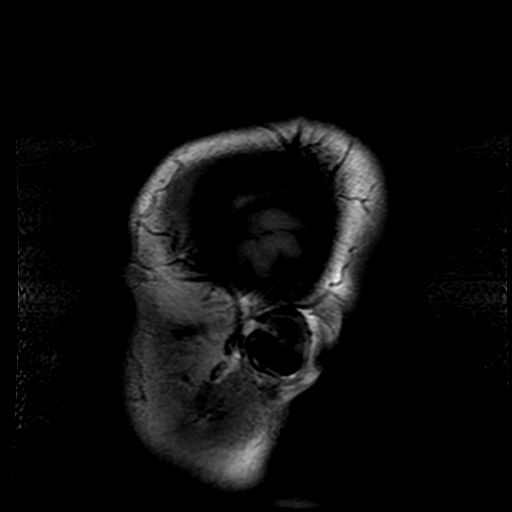

[Series 4: DWI · axial · 3.0mm · 1.09mm/px · z∈[-44,+102]mm · 9 of 100 slices shown (1 of 4)]
[im 1/100]
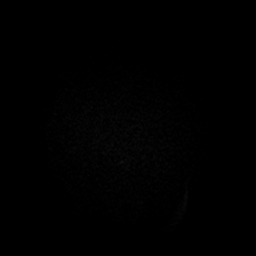
[im 13/100]
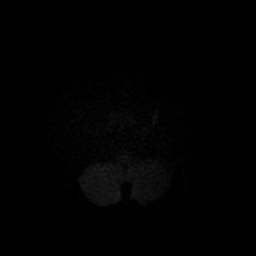
[im 25/100]
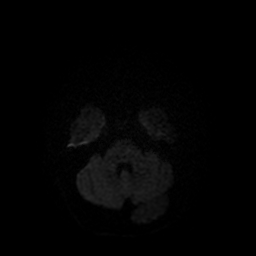
[im 38/100]
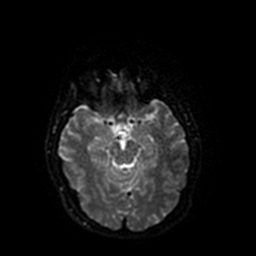
[im 50/100]
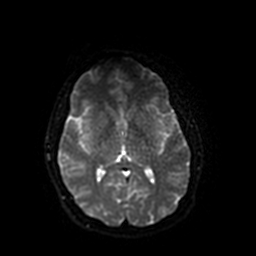
[im 62/100]
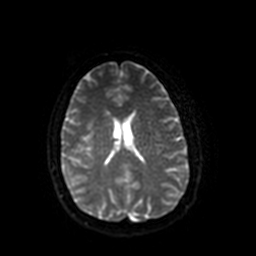
[im 75/100]
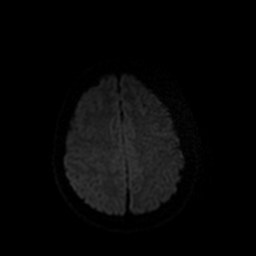
[im 87/100]
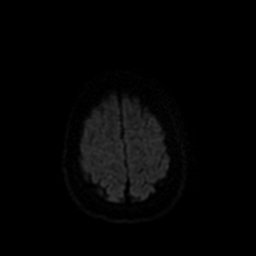
[im 100/100]
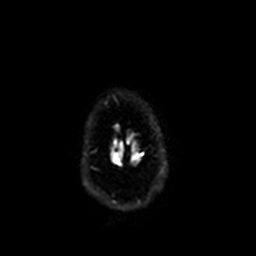

[Series 5: T2 · axial · 5.0mm · 0.90mm/px · z∈[-44,+99]mm · 2 of 25 slices shown]
[im 1/25]
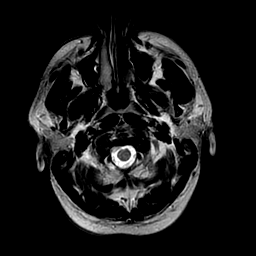
[im 25/25]
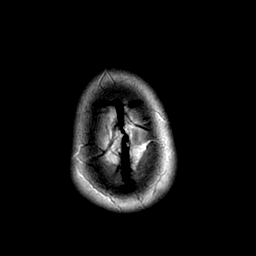

[Series 6: FLAIR · axial · 5.0mm · 0.90mm/px · z∈[-44,+99]mm · 2 of 25 slices shown]
[im 1/25]
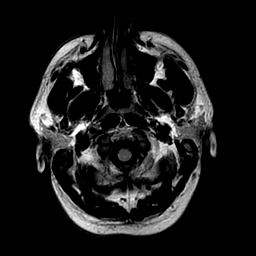
[im 25/25]
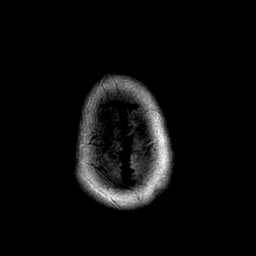

[Series 7: ax mpgr · axial · 5.0mm · 0.45mm/px · z∈[-46,+101]mm · 2 of 22 slices shown]
[im 1/22]
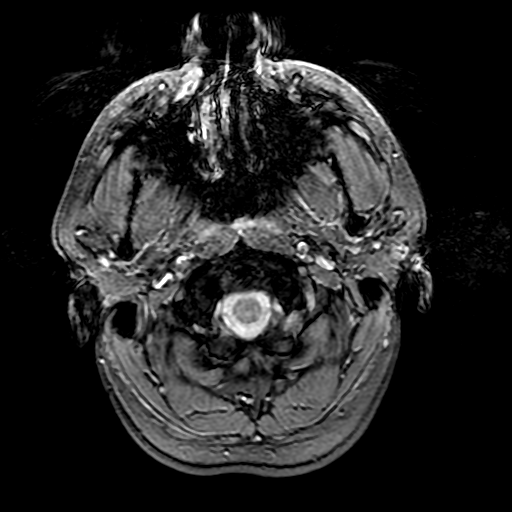
[im 22/22]
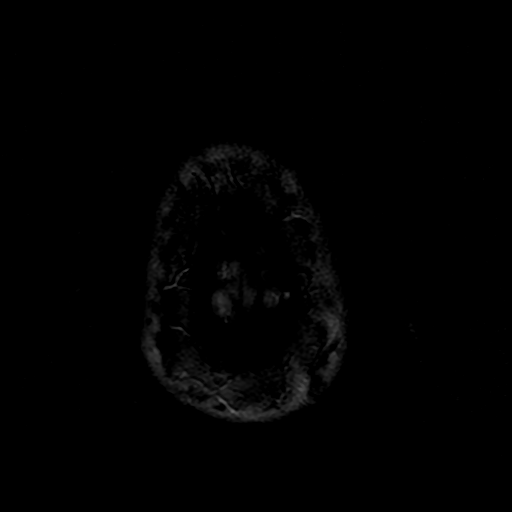

[Series 8: ax fspgr irp · axial · 3.0mm · 0.47mm/px · 1 of 50 slices shown]
[im 1/50]
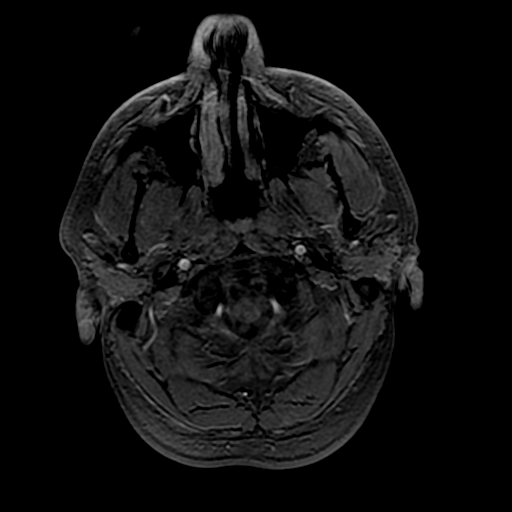

[Series 9: DWI · coronal · 4.0mm · 1.09mm/px · 8 of 88 slices shown (2 of 4)]
[im 1/88]
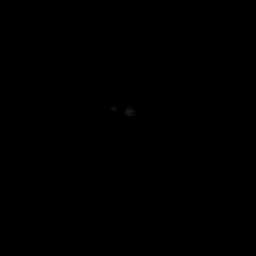
[im 13/88]
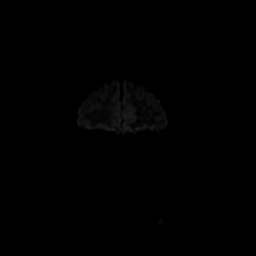
[im 25/88]
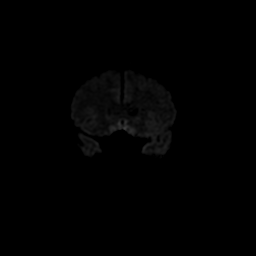
[im 38/88]
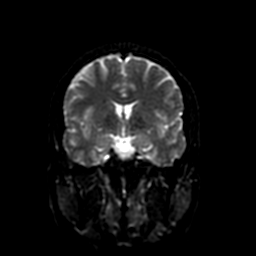
[im 50/88]
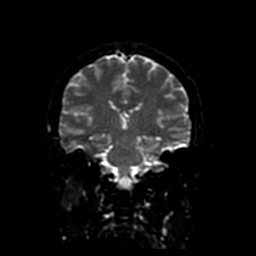
[im 63/88]
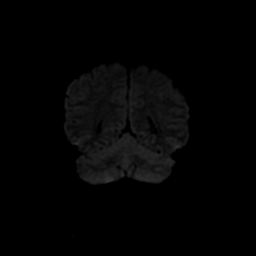
[im 75/88]
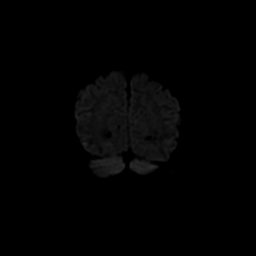
[im 88/88]
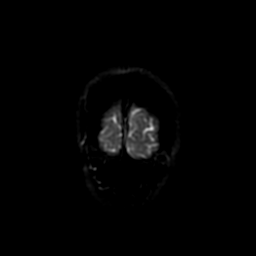

[Series 10: T2 post-contrast · coronal · 5.0mm · 0.90mm/px · 2 of 28 slices shown]
[im 1/28]
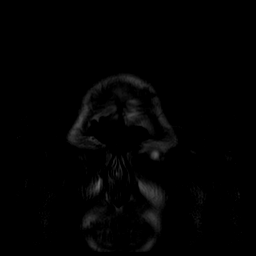
[im 28/28]
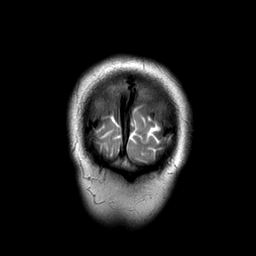

[Series 12: T1 post-contrast · coronal · 5.0mm · 0.45mm/px · 2 of 28 slices shown (1 of 2)]
[im 1/28]
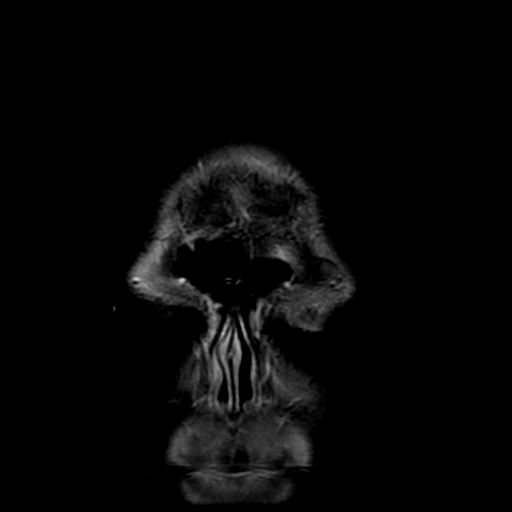
[im 28/28]
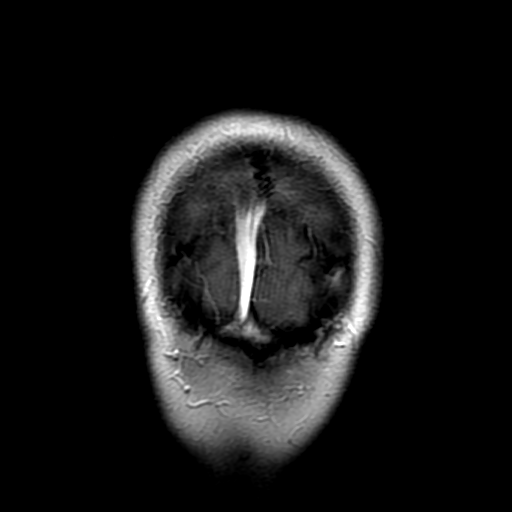

[Series 13: T1 post-contrast · sagittal · 5.0mm · 0.47mm/px · 2 of 24 slices shown (2 of 2)]
[im 1/24]
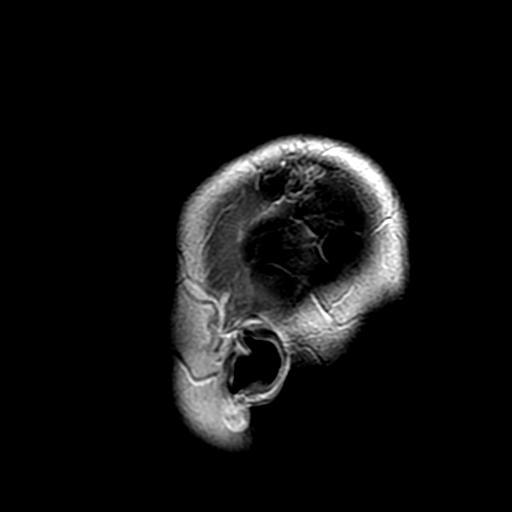
[im 24/24]
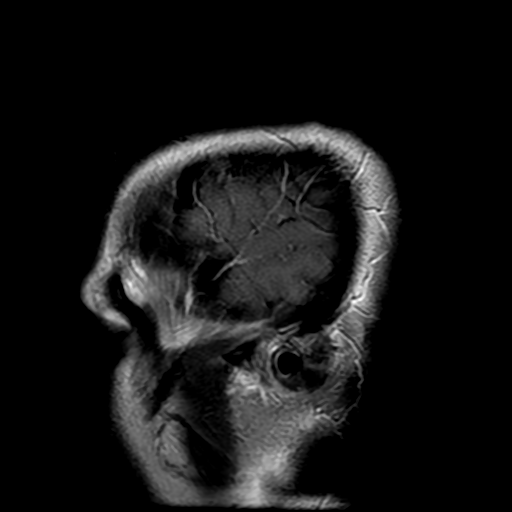

[Series 400: DWI · axial · 3.0mm · 1.09mm/px · z∈[-44,+102]mm · 4 of 50 slices shown (3 of 4)]
[im 1/50]
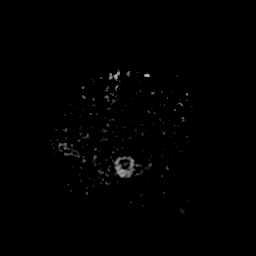
[im 17/50]
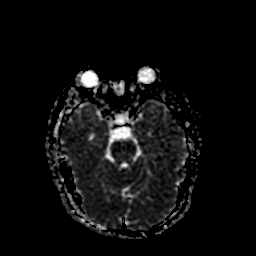
[im 33/50]
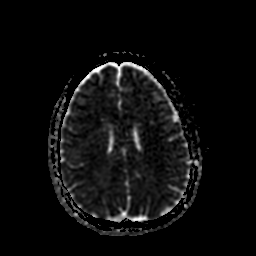
[im 50/50]
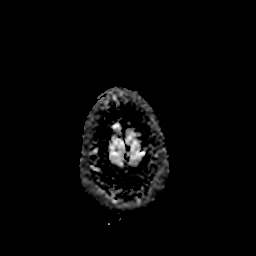

[Series 900: DWI · coronal · 4.0mm · 1.09mm/px · 4 of 44 slices shown (4 of 4)]
[im 1/44]
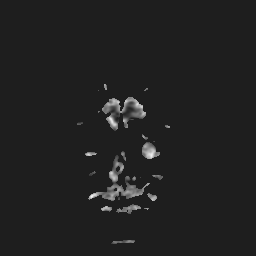
[im 15/44]
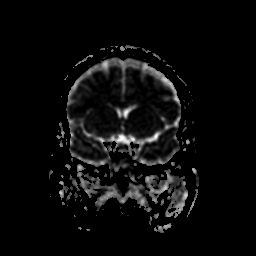
[im 29/44]
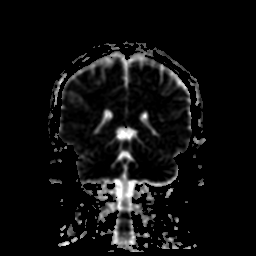
[im 44/44]
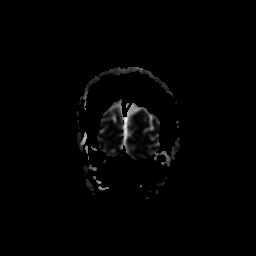

[41 of 48 positions shown; findings below may reference images not displayed]

FINDINGS: Brain: No infarction, hemorrhage, hydrocephalus, extra-axial
collection or mass lesion.

Vascular: Normal flow voids and vascular enhancements.

Skull and upper cervical spine: Normal marrow signal.

Sinuses/Orbits: Negative.

Other: Mild motion degraded.
IMPRESSION: Negative brain MRI.

## 2019-11-27 IMAGING — CR DG CHEST 2V
2 series · 2 of 2 positions shown · non-contrast
Comparison: 12/07/2016.

CLINICAL DATA: 22-year-old male with cough and body aches.

EXAM:
CHEST - 2 VIEW

[w chest pa]
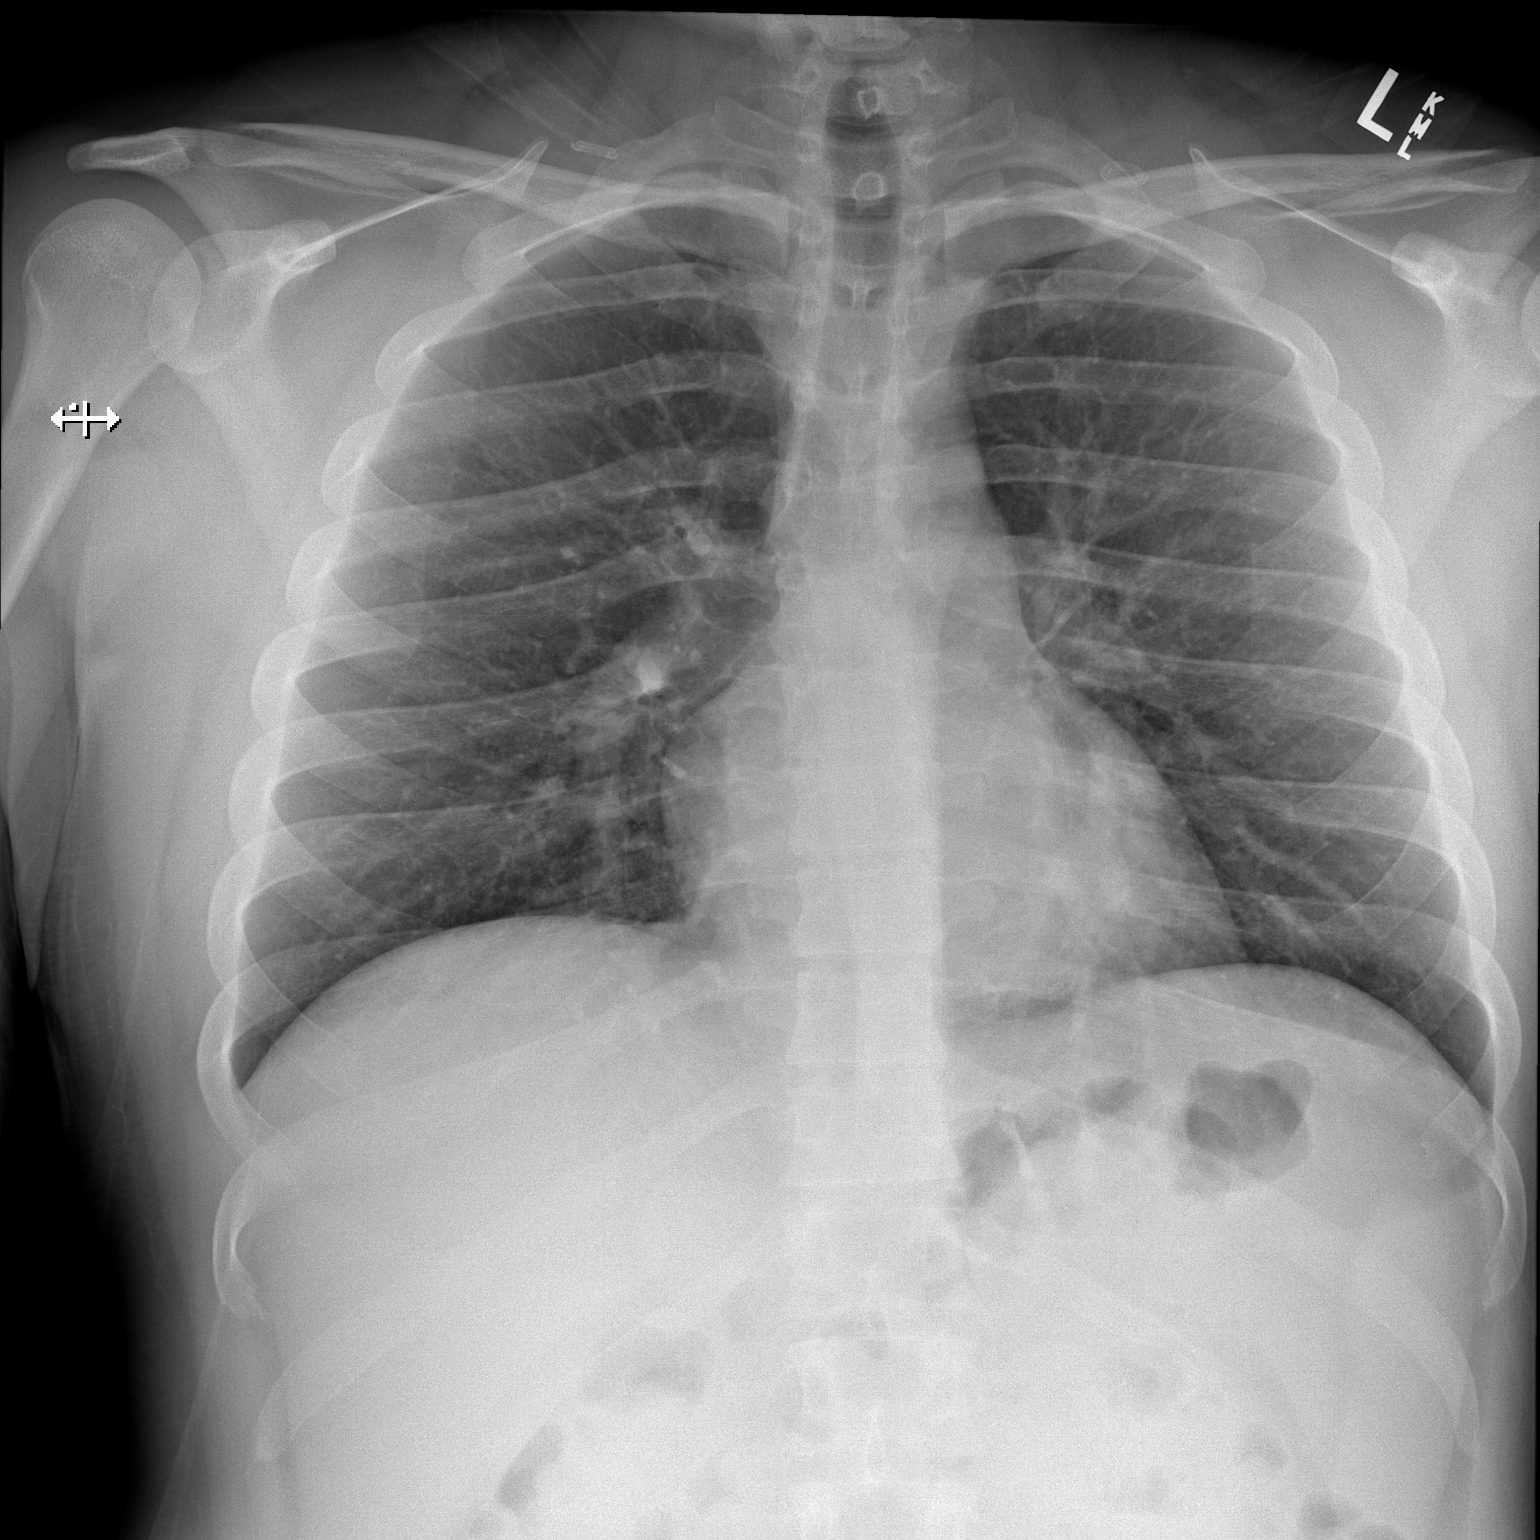

[w chest lat]
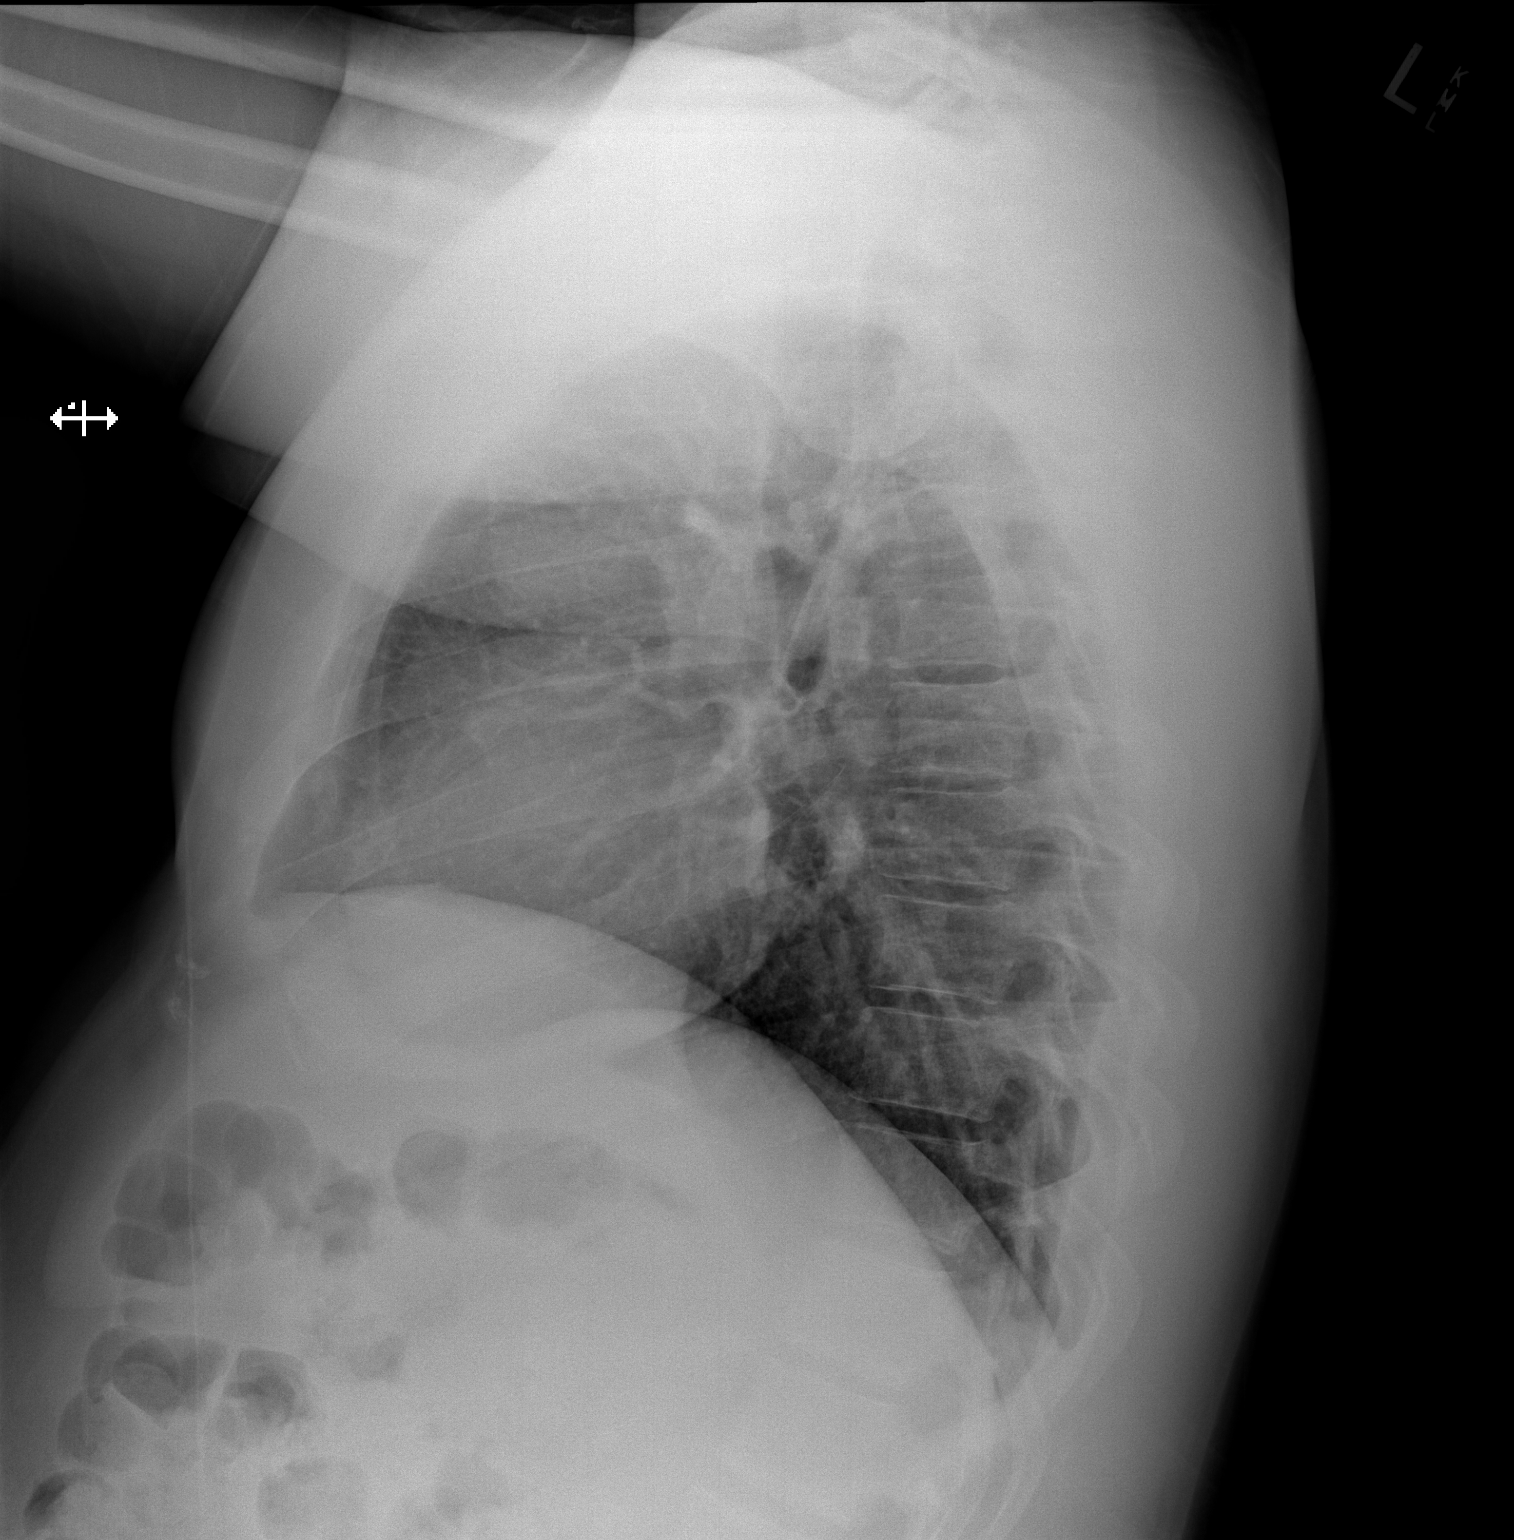

[2 of 2 positions shown; findings below may reference images not displayed]

FINDINGS: The heart size and mediastinal contours are within normal limits.
Both lungs are clear. No pneumothorax or pleural effusion. Negative
visible bowel gas pattern. The visualized skeletal structures are
unremarkable.
IMPRESSION: Negative.  No cardiopulmonary abnormality.
